# Patient Record
Sex: Male | Born: 1985 | Race: Black or African American | Hispanic: No | Marital: Single | Smoking: Current every day smoker
Health system: Southern US, Community
[De-identification: ages and names within clinical notes are randomized; demographics above are authoritative.]

---

## 2009-12-12 ENCOUNTER — Emergency Department (HOSPITAL_COMMUNITY): Admission: EM | Admit: 2009-12-12 | Discharge: 2009-12-13 | Payer: Self-pay | Admitting: Emergency Medicine

## 2010-07-28 LAB — RAPID STREP SCREEN (MED CTR MEBANE ONLY): Streptococcus, Group A Screen (Direct): NEGATIVE

## 2011-07-25 ENCOUNTER — Encounter (HOSPITAL_COMMUNITY): Payer: Self-pay | Admitting: Emergency Medicine

## 2011-07-25 ENCOUNTER — Emergency Department (HOSPITAL_COMMUNITY): Payer: Self-pay

## 2011-07-25 ENCOUNTER — Emergency Department (HOSPITAL_COMMUNITY)
Admission: EM | Admit: 2011-07-25 | Discharge: 2011-07-25 | Disposition: A | Discharge status: Home / Self Care | Payer: Self-pay | Attending: Emergency Medicine | Admitting: Emergency Medicine

## 2011-07-25 DIAGNOSIS — IMO0002 Reserved for concepts with insufficient information to code with codable children: Secondary | ICD-10-CM | POA: Insufficient documentation

## 2011-07-25 DIAGNOSIS — S62309A Unspecified fracture of unspecified metacarpal bone, initial encounter for closed fracture: Secondary | ICD-10-CM | POA: Insufficient documentation

## 2011-07-25 DIAGNOSIS — M7989 Other specified soft tissue disorders: Secondary | ICD-10-CM | POA: Insufficient documentation

## 2011-07-25 DIAGNOSIS — M79609 Pain in unspecified limb: Secondary | ICD-10-CM | POA: Insufficient documentation

## 2011-07-25 MED ORDER — HYDROCODONE-ACETAMINOPHEN 5-325 MG PO TABS
1.0000 | ORAL_TABLET | Freq: Once | ORAL | Status: DC
  Filled 2011-07-25: qty 1

## 2011-07-25 MED ORDER — HYDROCODONE-ACETAMINOPHEN 5-325 MG PO TABS: 1.0000 | ORAL_TABLET | Freq: Four times a day (QID) | ORAL | Status: AC | PRN

## 2011-07-25 MED ORDER — NAPROXEN 500 MG PO TABS: 500.0000 mg | ORAL_TABLET | Freq: Two times a day (BID) | ORAL | Status: AC

## 2011-07-25 NOTE — Progress Notes (Signed)
Orthopedic Tech Progress Note Patient Details:  Benjamin Olsen 25-Mar-1986 [id number]  Type of Splint: Other (comment) Splint Location: right arm Splint Interventions: Application    [name] T 07/25/2011, 2:15 PM

## 2011-07-25 NOTE — ED Notes (Signed)
Onset today patient punched wood door with right hand edema +2 pain currently 7/10 dull. Radial pulse +2 full sensation. Ice applied in triage.

## 2011-07-25 NOTE — ED Notes (Signed)
Ortho contacted re: application of splint

## 2011-07-25 NOTE — Discharge Instructions (Signed)
Hand Fracture Your caregiver has diagnosed you with a fractured (broken) bone in your hand. If the bones are in good position and the hand is properly immobilized and rested, these injuries will usually heal in 3 to 6 weeks. A cast, splint, or bulky bandage is usually applied to keep the fracture site from moving. Do not remove the splint or cast until your caregiver approves. If the fracture is unstable or the bones are not aligned properly, surgery may be needed. Keep your hand raised (elevated) above the level of your heart as much as possible for the next 2 to 3 days until the swelling and pain are better. Apply ice packs for 15 to 20 minutes every 3 to 4 hours to help control the pain and swelling. See your caregiver or an orthopedic specialist as directed for follow-up care to make sure the fracture is beginning to heal properly. SEEK IMMEDIATE MEDICAL CARE IF:   You notice your fingers are cold, numb, crooked, or the pain of your injury is severe.   You are not improving or seem to be getting worse.   You have questions or concerns.  Document Released: 06/07/2004 Document Revised: 04/19/2011 Document Reviewed: 10/26/2008 [facility] Patient Information 2012 [place], [place].Cast or Splint Care Casts and splints support injured limbs and keep bones from moving while they heal.  HOME CARE  Keep the cast or splint uncovered during the drying period.   A plaster cast can take 24 to 48 hours to dry.   A fiberglass cast will dry in less than 1 hour.   Do not rest the cast on anything harder than a pillow for 24 hours.   Do not put weight on your injured limb. Do not put pressure on the cast. Wait for your doctor's approval.   Keep the cast or splint dry.   Cover the cast or splint with a plastic bag during baths or wet weather.   If you have a cast over your chest and belly (trunk), take sponge baths until the cast is taken off.   Keep your cast or splint clean. Wash a dirty cast with a  damp cloth.   Do not put any objects under your cast or splint. Do not scratch the skin under the cast with an object.   Do not take out the padding from inside your cast.   Exercise your joints near the cast as told by your doctor.   Raise (elevate) your injured limb on 1 or 2 pillows for the first 1 to 3 days.  GET HELP RIGHT AWAY IF:  Your cast or splint cracks.   Your cast or splint is too tight or too loose.   You itch badly under the cast.   Your cast gets wet or has a soft spot.   You have a bad smell coming from the cast.   You get an object stuck under the cast.   Your skin around the cast becomes red or raw.   You have new or more pain after the cast is put on.   You have fluid leaking through the cast.   You cannot move your fingers or toes.   Your fingers or toes turn colors or are cool, painful, or puffy (swollen).   You have tingling or lose feeling (numbness) around the injured area.   You have pain or pressure under the cast.   You have trouble breathing or have shortness of breath.   You have chest pain.  MAKE  SURE YOU:  Understand these instructions.   Will watch your condition.   Will get help right away if you are not doing well or get worse.  Document Released: 08/30/2010 Document Revised: 04/19/2011 Document Reviewed: 08/30/2010 [facility] Patient Information 2012 [place], [place].

## 2011-07-25 NOTE — ED Provider Notes (Signed)
History     CSN: [id number]  Arrival date & time 07/25/11  1205   First MD Initiated Contact with Patient 07/25/11 1327      Chief Complaint  Patient presents with  . Hand Injury    (Consider location/radiation/quality/duration/timing/severity/associated sxs/prior treatment) Patient is a 26 y.o. male presenting with hand injury. The history is provided by the patient.  Hand Injury  The incident occurred 3 to 5 hours ago. Injury mechanism: Punched solid wood door. The pain is present in the right hand. The quality of the pain is described as sharp. The pain is at a severity of 5/10. The pain is moderate. The pain has been constant since the incident. Pertinent negatives include no fever. He reports no foreign bodies present. The symptoms are aggravated by palpation.    History reviewed. No pertinent past medical history.  History reviewed. No pertinent past surgical history.  No family history on file.  History  Substance Use Topics  . Smoking status: Never Smoker   . Smokeless tobacco: Not on file  . Alcohol Use: No      Review of Systems  Constitutional: Negative for fever.  HENT: Negative for neck pain.   Eyes: Negative for redness.  Respiratory: Negative for shortness of breath.   Cardiovascular: Negative for chest pain.  Gastrointestinal: Negative for abdominal pain.  Genitourinary: Negative for dysuria.  Musculoskeletal: Negative for back pain.  Neurological: Negative for headaches.  Hematological: Does not bruise/bleed easily.    Allergies  Review of patient's allergies indicates no known allergies.  Home Medications   Current Outpatient Rx  Name Route Sig Dispense Refill  . HYDROCODONE-ACETAMINOPHEN 5-325 MG PO TABS Oral Take 1-2 tablets by mouth every 6 (six) hours as needed for pain. 10 tablet 0  . NAPROXEN 500 MG PO TABS Oral Take 1 tablet (500 mg total) by mouth 2 (two) times daily. 14 tablet 0    BP 151/101  Pulse 80  Temp(Src) 98.6 F (37 C)  (Oral)  Resp 16  SpO2 100%  Physical Exam  Nursing note and vitals reviewed. Constitutional: He is oriented to person, place, and time. He appears well-developed and well-nourished.  HENT:  Head: Normocephalic and atraumatic.  Eyes: Conjunctivae are normal. Pupils are equal, round, and reactive to light.  Neck: Normal range of motion. Neck supple.  Cardiovascular: Normal rate, regular rhythm, normal heart sounds and intact distal pulses.   No murmur heard. Pulmonary/Chest: Effort normal and breath sounds normal.  Abdominal: Soft. Bowel sounds are normal.  Musculoskeletal: He exhibits tenderness.       Normal except for right hand with swelling over the distal part of the fifth metacarpal no open wounds good cap refill to fifth nail bed sensation intact reasonable range of motion of fifth digit.  Neurological: He is alert and oriented to person, place, and time. No cranial nerve deficit. He exhibits normal muscle tone. Coordination normal.  Skin: Skin is warm. No rash noted.    ED Course  Procedures (including critical care time)  Labs Reviewed - No data to display Dg Hand Complete Right  07/25/2011  *RADIOLOGY REPORT*  Clinical Data: Right hand injury, pain, swelling.  RIGHT HAND - COMPLETE 3+ VIEW  Comparison: None  Findings: Fracture noted through the right fifth metacarpal. Slight angulation.  Overlying soft tissue swelling.  No additional acute bony abnormality.  IMPRESSION: Right fifth metacarpal fracture.  Original Report Authenticated By: Cyndie Chime, M.D.     1. Fracture of metacarpal  MDM  Patient punched a wooden door with his right hand complained of swelling and pain over the fifth metacarpal area.        Shelda Jakes, MD 07/25/11 [phone]

## 2013-07-26 IMAGING — CR DG HAND COMPLETE 3+V*R*
3 series · 3 of 3 positions shown · non-contrast
Comparison: None

CLINICAL DATA: Right hand injury, pain, swelling.

RIGHT HAND - COMPLETE 3+ VIEW

[x hand pa right]
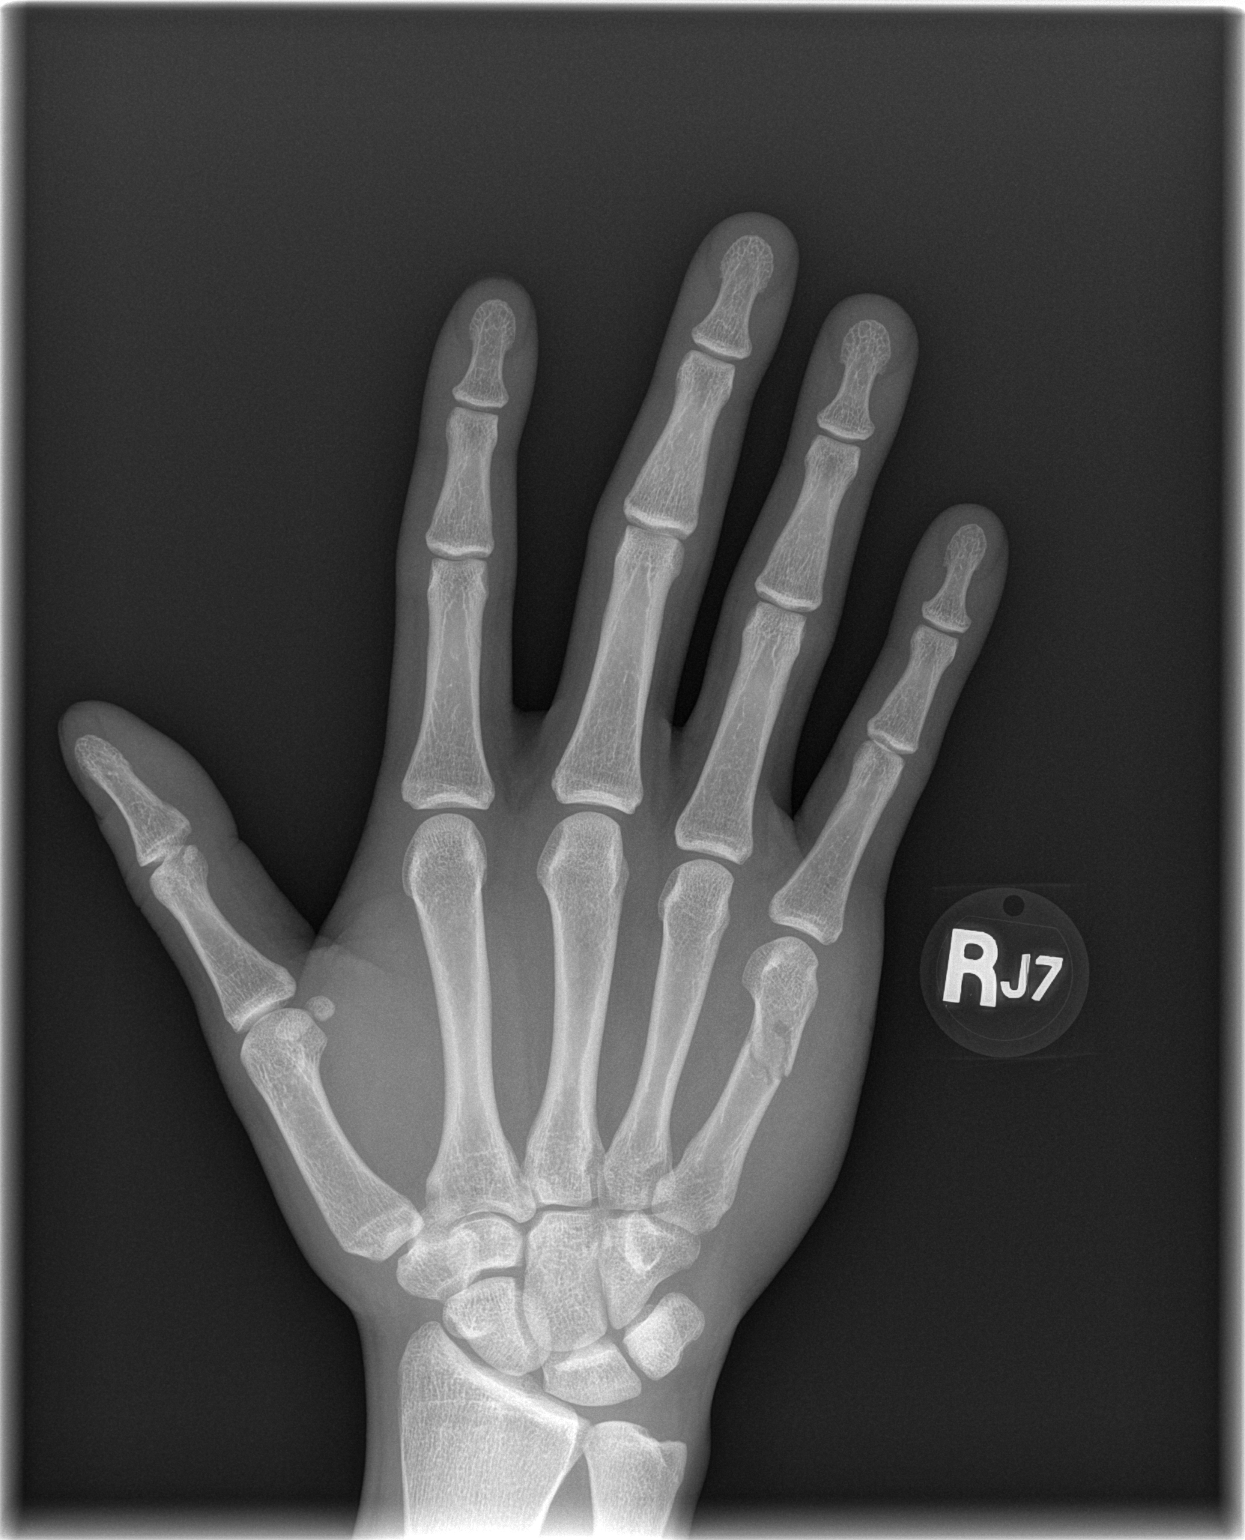

[x hand oblique right]
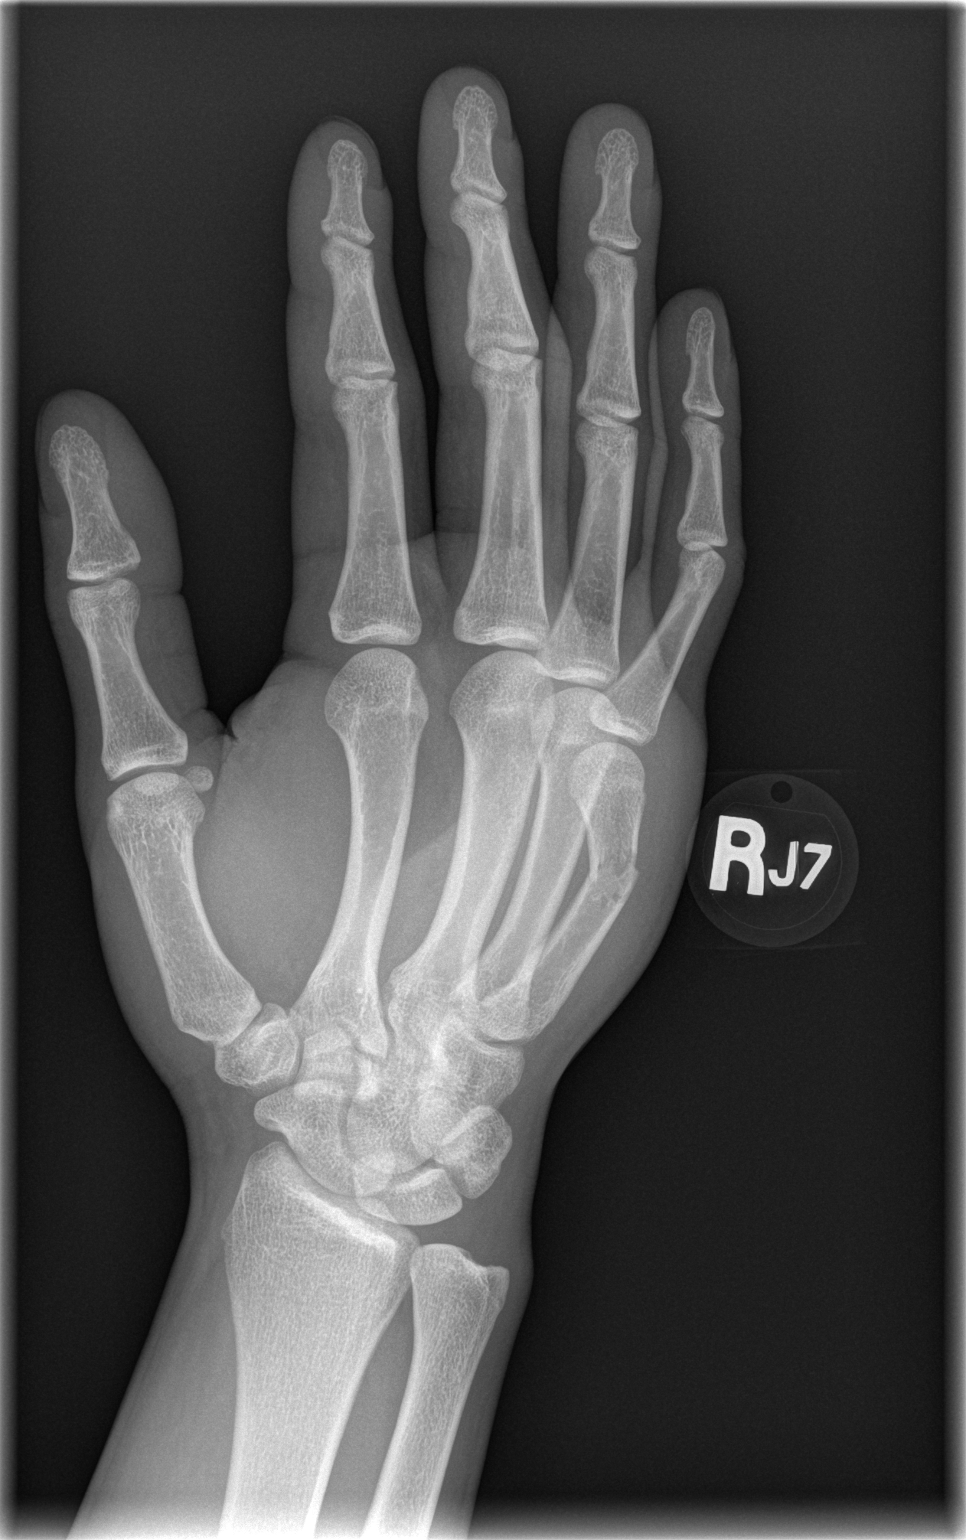

[x hand lat right]
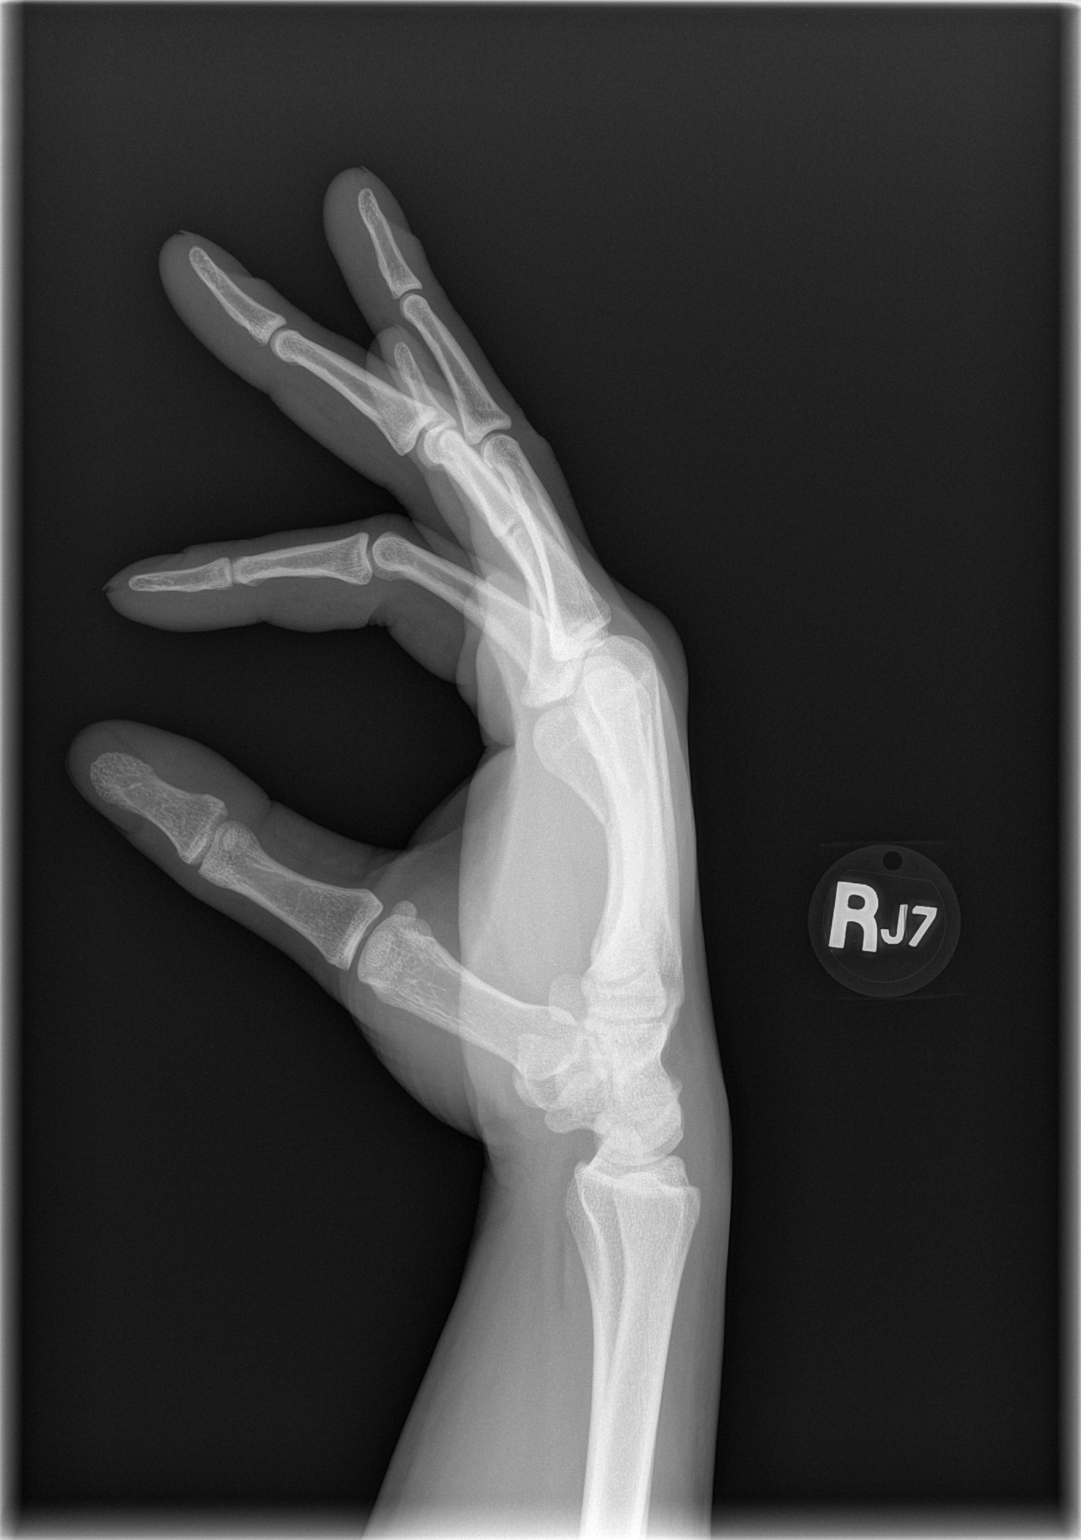

[3 of 3 positions shown; findings below may reference images not displayed]

FINDINGS: Fracture noted through the right fifth metacarpal.
Slight angulation.  Overlying soft tissue swelling.  No additional
acute bony abnormality.
IMPRESSION: Right fifth metacarpal fracture.

## 2016-10-20 ENCOUNTER — Encounter (HOSPITAL_COMMUNITY): Payer: Self-pay | Admitting: Emergency Medicine

## 2016-10-20 ENCOUNTER — Ambulatory Visit (HOSPITAL_COMMUNITY)
Admission: EM | Admit: 2016-10-20 | Discharge: 2016-10-20 | Disposition: A | Discharge status: Home / Self Care | Payer: Self-pay | Attending: Internal Medicine | Admitting: Internal Medicine

## 2016-10-20 DIAGNOSIS — B356 Tinea cruris: Secondary | ICD-10-CM | POA: Insufficient documentation

## 2016-10-20 DIAGNOSIS — F17210 Nicotine dependence, cigarettes, uncomplicated: Secondary | ICD-10-CM | POA: Insufficient documentation

## 2016-10-20 DIAGNOSIS — Z202 Contact with and (suspected) exposure to infections with a predominantly sexual mode of transmission: Secondary | ICD-10-CM | POA: Insufficient documentation

## 2016-10-20 DIAGNOSIS — L298 Other pruritus: Secondary | ICD-10-CM

## 2016-10-20 MED ORDER — CLOTRIMAZOLE 1 % EX CREA: TOPICAL_CREAM | CUTANEOUS | 1 refills | Status: AC

## 2016-10-20 NOTE — ED Triage Notes (Signed)
Pt here for rash on groin area onset 2-3 weeks  Also reports ex GF called him and stated she had chlamydia  Denies penile d/c, dysuria, fevers, chills  A&O x4... NAD... Ambulatory

## 2016-10-20 NOTE — ED Provider Notes (Signed)
CSN: [id number]     Arrival date & time 10/20/16  1654 History   First MD Initiated Contact with Patient 10/20/16 1750     Chief Complaint  Patient presents with  . Exposure to STD   (Consider location/radiation/quality/duration/timing/severity/associated sxs/prior Treatment) HPI Benjamin Olsen is a 31 y.o. male presenting to UC with c/o 2-3 weeks of darkened rash in groin area that is mildly pruritic.  He is concerned because his ex-girlfriend recently told him she tested positive for chlamydia.  He was with her last about 5 weeks ago.  Denies penile discharge or dysuria. Denies abdominal pain or back pain. No other symptoms. He has not tried anything for current symptoms.    History reviewed. No pertinent past medical history. History reviewed. No pertinent surgical history. History reviewed. No pertinent family history. Social History  Substance Use Topics  . Smoking status: Current Every Day Smoker    Types: Cigarettes  . Smokeless tobacco: Never Used  . Alcohol use No    Review of Systems  Gastrointestinal: Negative for abdominal pain, nausea and vomiting.  Genitourinary: Negative for discharge, dysuria, frequency, hematuria, penile pain and testicular pain.  Musculoskeletal: Negative for back pain.  Skin: Positive for color change and rash. Negative for pallor and wound.       Rash in groin    Allergies  Patient has no known allergies.  Home Medications   Prior to Admission medications   Medication Sig Start Date End Date Taking? Authorizing Provider  clotrimazole (LOTRIMIN) 1 % cream Apply to affected area 2 times daily for 2 weeks 10/20/16   Junius Finner'Malley, Tyger Oka, PA-C   Meds Ordered and Administered this Visit  Medications - No data to display  BP 123/66 (BP Location: Right Arm)   Pulse (!) 101   Temp 98.4 F (36.9 C) (Oral)   Resp 20   SpO2 99%  No data found.   Physical Exam  Constitutional: He is oriented to person, place, and time. He appears well-developed  and well-nourished.  HENT:  Head: Normocephalic and atraumatic.  Eyes: EOM are normal.  Neck: Normal range of motion.  Cardiovascular: Normal rate.   Pulmonary/Chest: Effort normal.  Genitourinary:  Genitourinary Comments: Hyperpigmented rash in groin, well defined jagged borders. Non-tender. Dry rash.   Musculoskeletal: Normal range of motion.  Neurological: He is alert and oriented to person, place, and time.  Skin: Skin is warm and dry.  Psychiatric: He has a normal mood and affect. His behavior is normal.  Nursing note and vitals reviewed.   Urgent Care Course     Procedures (including critical care time)  Labs Review Labs Reviewed  URINE CYTOLOGY ANCILLARY ONLY    Imaging Review No results found.    MDM   1. Jock itch   2. Exposure to STD    Rash most c/w tinea cruris w/o evidence of underlying bacterial infection  Urine sent to lab for gonorrhea/chlamydia. Discussed empiric treatment. Pt declined for now.   Pt will be notified if tests come back positive for GC/chlamydia.  Rx: clotrimazole cream BID for 2 weeks F/u with PCP in 1-2 weeks if not improving.     Junius FinnerO'Malley, Dilia Alemany, PA-C 10/20/16 [id number]

## 2016-10-22 LAB — URINE CYTOLOGY ANCILLARY ONLY
Chlamydia: NEGATIVE
Neisseria Gonorrhea: NEGATIVE
